# Patient Record
Sex: Male | Born: 1970 | Race: Black or African American | Hispanic: No | Marital: Married | Smoking: Never smoker
Health system: Southern US, Community
[De-identification: ages and names within clinical notes are randomized; demographics above are authoritative.]

---

## 2005-08-06 ENCOUNTER — Encounter: Admission: RE | Admit: 2005-08-06 | Discharge: 2005-08-06 | Payer: Self-pay | Admitting: Emergency Medicine

## 2007-03-17 ENCOUNTER — Encounter: Admission: RE | Admit: 2007-03-17 | Discharge: 2007-03-17 | Payer: Self-pay | Admitting: Family Medicine

## 2007-05-09 ENCOUNTER — Encounter: Admission: RE | Admit: 2007-05-09 | Discharge: 2007-05-09 | Payer: Self-pay | Admitting: Family Medicine

## 2013-05-22 ENCOUNTER — Ambulatory Visit
Admission: RE | Admit: 2013-05-22 | Discharge: 2013-05-22 | Disposition: A | Discharge status: Home / Self Care | Payer: BC Managed Care – PPO | Source: Ambulatory Visit | Attending: Family Medicine | Admitting: Family Medicine

## 2013-05-22 ENCOUNTER — Other Ambulatory Visit: Payer: Self-pay | Admitting: Family Medicine

## 2013-05-22 DIAGNOSIS — R200 Anesthesia of skin: Secondary | ICD-10-CM

## 2013-06-08 ENCOUNTER — Encounter: Payer: BC Managed Care – PPO | Admitting: Diagnostic Neuroimaging

## 2013-06-08 ENCOUNTER — Ambulatory Visit (INDEPENDENT_AMBULATORY_CARE_PROVIDER_SITE_OTHER): Payer: Self-pay

## 2013-06-08 ENCOUNTER — Ambulatory Visit (INDEPENDENT_AMBULATORY_CARE_PROVIDER_SITE_OTHER): Payer: 59 | Admitting: Diagnostic Neuroimaging

## 2013-06-08 DIAGNOSIS — G5600 Carpal tunnel syndrome, unspecified upper limb: Secondary | ICD-10-CM

## 2013-06-08 DIAGNOSIS — R200 Anesthesia of skin: Secondary | ICD-10-CM

## 2013-06-08 DIAGNOSIS — G5602 Carpal tunnel syndrome, left upper limb: Secondary | ICD-10-CM

## 2013-06-08 DIAGNOSIS — R202 Paresthesia of skin: Principal | ICD-10-CM

## 2013-06-08 DIAGNOSIS — Z0289 Encounter for other administrative examinations: Secondary | ICD-10-CM

## 2013-06-08 DIAGNOSIS — G5601 Carpal tunnel syndrome, right upper limb: Secondary | ICD-10-CM

## 2013-06-08 NOTE — Procedures (Signed)
   [facility]  NCS (NERVE CONDUCTION STUDY) WITH EMG (ELECTROMYOGRAPHY) REPORT   STUDY DATE: 06/05/13 PATIENT NAME: Jesus [name] DOB: 1971-02-17 MRN: [id number]  ORDERING CLINICIAN: Okey Regalarol Web, MD   TECHNOLOGIST: Geroge BasemanLorraine Jone  ELECTROMYOGRAPHER: Glenford BayleyVikram R. Paraskevi Funez, MD  CLINICAL INFORMATION: 43 year old male with hand numbness (right > left).  FINDINGS: NERVE CONDUCTION STUDY: Bilateral median motor responses have prolonged distal latencies (right 4.2 ms, left 4.4 ms, normal less than or equal to 4.2 ms), normal amplitudes, normal conduction velocities and normal F-wave latencies. Bilateral ulnar motor responses and F-wave latencies are normal. Bilateral median sensory responses have decreased amplitudes and slow conduction velocities (right 37 m/s, left 38 m/s, normal greater than or equal to 50 m/s). Bilateral ulnar sensory responses are normal.  NEEDLE ELECTROMYOGRAPHY: Needle examination of selected muscles of the right upper extremity (deltoid, biceps, triceps, flexor carpi radialis, first dorsal interosseous) and right C5-6 and C7-T1 paraspinal muscles are normal. No abnormal spontaneous activity at rest and normal motor unit recruitment on exertion.   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   INTERPRETING PHYSICIAN:  Suanne MarkerVIKRAM R. Jerimie Mancuso, MD Certified in Neurology, Neurophysiology and Neuroimaging  [facility] [address], [address] [place], [place] [address] [phone]

## 2016-09-17 DIAGNOSIS — I83899 Varicose veins of unspecified lower extremities with other complications: Secondary | ICD-10-CM | POA: Diagnosis not present

## 2016-09-17 DIAGNOSIS — I872 Venous insufficiency (chronic) (peripheral): Secondary | ICD-10-CM | POA: Diagnosis not present

## 2016-09-17 DIAGNOSIS — M79669 Pain in unspecified lower leg: Secondary | ICD-10-CM | POA: Diagnosis not present

## 2016-12-21 DIAGNOSIS — M5432 Sciatica, left side: Secondary | ICD-10-CM | POA: Diagnosis not present

## 2017-02-06 DIAGNOSIS — M79669 Pain in unspecified lower leg: Secondary | ICD-10-CM | POA: Diagnosis not present

## 2017-02-06 DIAGNOSIS — I83899 Varicose veins of unspecified lower extremities with other complications: Secondary | ICD-10-CM | POA: Diagnosis not present

## 2017-02-06 DIAGNOSIS — I872 Venous insufficiency (chronic) (peripheral): Secondary | ICD-10-CM | POA: Diagnosis not present

## 2017-02-20 DIAGNOSIS — Z13220 Encounter for screening for lipoid disorders: Secondary | ICD-10-CM | POA: Diagnosis not present

## 2017-02-20 DIAGNOSIS — Z0000 Encounter for general adult medical examination without abnormal findings: Secondary | ICD-10-CM | POA: Diagnosis not present

## 2017-02-20 DIAGNOSIS — R5382 Chronic fatigue, unspecified: Secondary | ICD-10-CM | POA: Diagnosis not present

## 2017-06-12 ENCOUNTER — Other Ambulatory Visit: Payer: Self-pay | Admitting: Family Medicine

## 2017-06-12 ENCOUNTER — Ambulatory Visit
Admission: RE | Admit: 2017-06-12 | Discharge: 2017-06-12 | Disposition: A | Discharge status: Home / Self Care | Payer: 59 | Source: Ambulatory Visit | Attending: Family Medicine | Admitting: Family Medicine

## 2017-06-12 DIAGNOSIS — M5489 Other dorsalgia: Secondary | ICD-10-CM

## 2017-06-12 DIAGNOSIS — M545 Low back pain: Secondary | ICD-10-CM | POA: Diagnosis not present

## 2017-06-12 DIAGNOSIS — M47816 Spondylosis without myelopathy or radiculopathy, lumbar region: Secondary | ICD-10-CM | POA: Diagnosis not present

## 2017-06-12 DIAGNOSIS — G8929 Other chronic pain: Secondary | ICD-10-CM | POA: Diagnosis not present

## 2017-06-25 DIAGNOSIS — M25572 Pain in left ankle and joints of left foot: Secondary | ICD-10-CM | POA: Diagnosis not present

## 2017-06-25 DIAGNOSIS — M545 Low back pain: Secondary | ICD-10-CM | POA: Diagnosis not present

## 2017-06-25 DIAGNOSIS — M79641 Pain in right hand: Secondary | ICD-10-CM | POA: Diagnosis not present

## 2017-07-02 DIAGNOSIS — M25572 Pain in left ankle and joints of left foot: Secondary | ICD-10-CM | POA: Diagnosis not present

## 2017-07-02 DIAGNOSIS — M79641 Pain in right hand: Secondary | ICD-10-CM | POA: Diagnosis not present

## 2017-07-02 DIAGNOSIS — M545 Low back pain: Secondary | ICD-10-CM | POA: Diagnosis not present

## 2017-07-19 DIAGNOSIS — M25572 Pain in left ankle and joints of left foot: Secondary | ICD-10-CM | POA: Diagnosis not present

## 2017-07-19 DIAGNOSIS — M79641 Pain in right hand: Secondary | ICD-10-CM | POA: Diagnosis not present

## 2017-07-19 DIAGNOSIS — M545 Low back pain: Secondary | ICD-10-CM | POA: Diagnosis not present

## 2017-07-26 DIAGNOSIS — M545 Low back pain: Secondary | ICD-10-CM | POA: Diagnosis not present

## 2017-07-26 DIAGNOSIS — M79641 Pain in right hand: Secondary | ICD-10-CM | POA: Diagnosis not present

## 2017-07-26 DIAGNOSIS — M25572 Pain in left ankle and joints of left foot: Secondary | ICD-10-CM | POA: Diagnosis not present

## 2017-08-05 DIAGNOSIS — M25572 Pain in left ankle and joints of left foot: Secondary | ICD-10-CM | POA: Diagnosis not present

## 2017-08-05 DIAGNOSIS — M545 Low back pain: Secondary | ICD-10-CM | POA: Diagnosis not present

## 2017-08-05 DIAGNOSIS — M79641 Pain in right hand: Secondary | ICD-10-CM | POA: Diagnosis not present

## 2017-08-23 DIAGNOSIS — M25572 Pain in left ankle and joints of left foot: Secondary | ICD-10-CM | POA: Diagnosis not present

## 2017-08-23 DIAGNOSIS — M545 Low back pain: Secondary | ICD-10-CM | POA: Diagnosis not present

## 2017-08-23 DIAGNOSIS — M79641 Pain in right hand: Secondary | ICD-10-CM | POA: Diagnosis not present

## 2017-08-29 DIAGNOSIS — M79641 Pain in right hand: Secondary | ICD-10-CM | POA: Diagnosis not present

## 2017-08-29 DIAGNOSIS — M545 Low back pain: Secondary | ICD-10-CM | POA: Diagnosis not present

## 2017-08-29 DIAGNOSIS — M25572 Pain in left ankle and joints of left foot: Secondary | ICD-10-CM | POA: Diagnosis not present

## 2018-04-02 DIAGNOSIS — Z0000 Encounter for general adult medical examination without abnormal findings: Secondary | ICD-10-CM | POA: Diagnosis not present

## 2018-04-02 DIAGNOSIS — Z125 Encounter for screening for malignant neoplasm of prostate: Secondary | ICD-10-CM | POA: Diagnosis not present

## 2018-05-26 ENCOUNTER — Ambulatory Visit
Admission: RE | Admit: 2018-05-26 | Discharge: 2018-05-26 | Disposition: A | Discharge status: Home / Self Care | Payer: 59 | Source: Ambulatory Visit | Attending: Family Medicine | Admitting: Family Medicine

## 2018-05-26 ENCOUNTER — Other Ambulatory Visit: Payer: Self-pay | Admitting: Family Medicine

## 2018-05-26 DIAGNOSIS — M79645 Pain in left finger(s): Secondary | ICD-10-CM

## 2018-08-08 DIAGNOSIS — J111 Influenza due to unidentified influenza virus with other respiratory manifestations: Secondary | ICD-10-CM | POA: Diagnosis not present

## 2018-08-08 DIAGNOSIS — R6889 Other general symptoms and signs: Secondary | ICD-10-CM | POA: Diagnosis not present

## 2018-10-17 DIAGNOSIS — K219 Gastro-esophageal reflux disease without esophagitis: Secondary | ICD-10-CM | POA: Diagnosis not present

## 2019-07-03 ENCOUNTER — Ambulatory Visit: Payer: 59 | Admitting: Family Medicine

## 2019-07-03 ENCOUNTER — Other Ambulatory Visit: Payer: Self-pay

## 2019-07-03 ENCOUNTER — Encounter: Payer: Self-pay | Admitting: Family Medicine

## 2019-07-03 ENCOUNTER — Ambulatory Visit (INDEPENDENT_AMBULATORY_CARE_PROVIDER_SITE_OTHER): Payer: 59

## 2019-07-03 VITALS — BP 120/80 | HR 63 | Ht 67.0 in | Wt 239.0 lb

## 2019-07-03 DIAGNOSIS — M7050 Other bursitis of knee, unspecified knee: Secondary | ICD-10-CM

## 2019-07-03 DIAGNOSIS — M25561 Pain in right knee: Secondary | ICD-10-CM | POA: Diagnosis not present

## 2019-07-03 NOTE — Patient Instructions (Signed)
Good to see you Thigh compression sleeve Vitamin D 2,000 IUs daily Exercise 3 times a week Voltaren gel over the counter See me again in 4-5 weeks

## 2019-07-03 NOTE — Progress Notes (Signed)
[facility] [address] [place] [address] Phone: [phone] Subjective:   I Ronelle Nigh am serving as a Neurosurgeon for Dr. Antoine Primas.  This visit occurred during the SARS-CoV-2 public health emergency.  Safety protocols were in place, including screening questions prior to the visit, additional usage of staff PPE, and extensive cleaning of exam room while observing appropriate contact time as indicated for disinfecting solutions.   I'm seeing this patient by the request  of:  Shirlean Mylar, MD  CC: Right knee pain  [id number]  Jesus [name] is a 49 y.o. male coming in with complaint of right knee pain. History of surgery in the right knee due to meniscus injury. Wears steel toe shoes at work. Noticed a knot on the medial side of the the knee. Some times he has calf pain.   Onset- chronic  Location - knee cap Duration- most painful after work  Character- throbbing  Aggravating factors- on his feet a lot  Reliving factors-  Therapies tried- prednisone Severity-  7/10 at its worse      Social History   Socioeconomic History  . Marital status: Divorced    Spouse name: Not on file  . Number of children: Not on file  . Years of education: Not on file  . Highest education level: Not on file  Occupational History  . Not on file  Tobacco Use  . Smoking status: Not on file  Substance and Sexual Activity  . Alcohol use: Not on file  . Drug use: Not on file  . Sexual activity: Not on file  Other Topics Concern  . Not on file  Social History Narrative  . Not on file   Social Determinants of Health   Financial Resource Strain:   . Difficulty of Paying Living Expenses: Not on file  Food Insecurity:   . Worried About Programme researcher, broadcasting/film/video in the Last Year: Not on file  . Ran Out of Food in the Last Year: Not on file  Transportation Needs:   . Lack of Transportation (Medical): Not on file  . Lack of Transportation (Non-Medical): Not  on file  Physical Activity:   . Days of Exercise per Week: Not on file  . Minutes of Exercise per Session: Not on file  Stress:   . Feeling of Stress : Not on file  Social Connections:   . Frequency of Communication with Friends and Family: Not on file  . Frequency of Social Gatherings with Friends and Family: Not on file  . Attends Religious Services: Not on file  . Active Member of Clubs or Organizations: Not on file  . Attends Banker Meetings: Not on file  . Marital Status: Not on file   Not on File no known drug allergies No family history on file.  No family history of autoimmune or No current outpatient medications on file.   Reviewed prior external information including notes and imaging from  primary care provider As well as notes that were available from care everywhere and other healthcare systems.  Past medical history, social, surgical and family history all reviewed in electronic medical record.  No pertanent information unless stated regarding to the chief complaint.   Review of Systems:  No headache, visual changes, nausea, vomiting, diarrhea, constipation, dizziness, abdominal pain, skin rash, fevers, chills, night sweats, weight loss, swollen lymph nodes, body aches,chest pain, shortness of breath, mood changes. POSITIVE muscle aches, joint swelling  Objective  Blood pressure  120/80, pulse 63, height 5\' 7"  (1.702 m), weight 239 lb (108.4 kg), SpO2 98 %.   General: No apparent distress alert and oriented x3 mood and affect normal, dressed appropriately.  HEENT: Pupils equal, extraocular movements intact  Respiratory: Patient's speak in full sentences and does not appear short of breath  Cardiovascular: No lower extremity edema, non tender, no erythema  Skin: Warm dry intact with no signs of infection or rash on extremities or on axial skeleton.  Abdomen: Soft nontender  Neuro: Cranial nerves II through XII are intact, neurovascularly intact in all  extremities with 2+ DTRs and 2+ pulses.  Lymph: No lymphadenopathy of posterior or anterior cervical chain or axillae bilaterally.  Gait normal with good balance and coordination.  MSK:  Non tender with full range of motion and good stability and symmetric strength and tone of shoulders, elbows, wrist, hip and ankles bilaterally.  Knee: Right knee Normal to inspection with no erythema or effusion or obvious bony abnormalities. Palpation pain over the medial area mostly over the pes anserine ROM full in flexion and extension and lower leg rotation. Ligaments with solid consistent endpoints including ACL, PCL, LCL, MCL. Negative Mcmurray's, Apley's, and Thessalonian tests. Non painful patellar compression. Patellar glide without crepitus. Patellar and quadriceps tendons unremarkable. Hamstring and quadriceps strength is normal. Contralateral knee unremarkable   Limited musculoskeletal ultrasound was performed and interpreted by Lyndal Pulley  Limited musculoskeletal ultrasound shows the patient does have severe Pez anserine bursitis.  Mild intrasubstance tearing of the hamstring tendon noted.  Medial meniscus does show surgical intervention noted.   Impression and Recommendations:     This case required medical decision making of moderate complexity. The above documentation has been reviewed and is accurate and complete Lyndal Pulley, DO       Note: This dictation was prepared with Dragon dictation along with smaller phrase technology. Any transcriptional errors that result from this process are unintentional.

## 2019-07-03 NOTE — Assessment & Plan Note (Addendum)
Discussed compression sleeve  Discussed home exercises, discussed which activities to do which wants to avoid.  We discussed icing regimen.  Topical anti-inflammatories we discussed as well and over-the-counter medications.  Determinants of some mild findings of instruments and secondary to Covid not able to go to certain places in the community.  Follow-up again in 4 weeks

## 2019-07-09 ENCOUNTER — Ambulatory Visit (INDEPENDENT_AMBULATORY_CARE_PROVIDER_SITE_OTHER): Payer: 59

## 2019-07-09 DIAGNOSIS — M25561 Pain in right knee: Secondary | ICD-10-CM

## 2019-08-13 ENCOUNTER — Ambulatory Visit: Payer: 59 | Admitting: Family Medicine

## 2019-08-26 ENCOUNTER — Ambulatory Visit: Payer: 59 | Admitting: Family Medicine

## 2019-08-26 ENCOUNTER — Other Ambulatory Visit: Payer: Self-pay

## 2019-08-26 ENCOUNTER — Encounter: Payer: Self-pay | Admitting: Family Medicine

## 2019-08-26 DIAGNOSIS — M7050 Other bursitis of knee, unspecified knee: Secondary | ICD-10-CM

## 2019-08-26 NOTE — Progress Notes (Signed)
Jesus [name] Phone: [phone] Subjective:   Jesus [name], am serving as a scribe for Dr. Hulan Saas. This visit occurred during the SARS-CoV-2 public health emergency.  Safety protocols were in place, including screening questions prior to the visit, additional usage of staff PPE, and extensive cleaning of exam room while observing appropriate contact time as indicated for disinfecting solutions.   I'm seeing this patient by the request  of:  Maurice Small, MD  CC: Right knee pain follow-up  IRS:[id number]   07/03/2019 Discussed compression sleeve  Discussed home exercises, discussed which activities to do which wants to avoid.  We discussed icing regimen.  Topical anti-inflammatories we discussed as well and over-the-counter medications.  Determinants of some mild findings of instruments and secondary to Covid not able to go to certain places in the community.  Follow-up again in 4 weeks  Update 08/26/2019 Jesus [name] is a 49 y.o. male coming in with complaint of right knee pain. Patient states that his pain been worse than last visit. Pain had improved but patient got COVID and has been sedentary. Noticed pain has been increasing. Patient states that it is starting affect daily activities, even tender to palpation when he touches the area.  States that at one point though the bump in the swelling in the area had completely resolved.    [name] past medical history on file. [name] past surgical history on file. Social History   Socioeconomic History  . Marital status: Divorced    Spouse name: Not on file  . Number of children: Not on file  . Years of education: Not on file  . Highest education level: Not on file  Occupational History  . Not on file  Tobacco Use  . Smoking status: Not on file  Substance and Sexual Activity  . Alcohol use: Not on file  . Drug use: Not on file  . Sexual activity: Not on file    Other Topics Concern  . Not on file  Social History Narrative  . Not on file   Social Determinants of Health   Financial Resource Strain:   . Difficulty of Paying Living Expenses:   Food Insecurity:   . Worried About Charity fundraiser in the Last Year:   . Arboriculturist in the Last Year:   Transportation Needs:   . Film/video editor (Medical):   Marland Kitchen Lack of Transportation (Non-Medical):   Physical Activity:   . Days of Exercise per Week:   . Minutes of Exercise per Session:   Stress:   . Feeling of Stress :   Social Connections:   . Frequency of Communication with Friends and Family:   . Frequency of Social Gatherings with Friends and Family:   . Attends Religious Services:   . Active Member of Clubs or Organizations:   . Attends Archivist Meetings:   Marland Kitchen Marital Status:    Not on File [name] family history on file. [name] current outpatient medications on file.   Reviewed prior external information including notes and imaging from  primary care provider As well as notes that were available from care everywhere and other healthcare systems.  Past medical history, social, surgical and family history all reviewed in electronic medical record.  [name] pertanent information unless stated regarding to the chief complaint.   Review of Systems:  [name] headache, visual changes, nausea, vomiting, diarrhea, constipation, dizziness, abdominal pain, skin rash, fevers,  chills, night sweats, weight loss, swollen lymph nodes, body aches, joint swelling, chest pain, shortness of breath, mood changes. POSITIVE muscle aches  Objective  Blood pressure 122/80, pulse 67, height 5\' 7"  (1.702 m), weight 239 lb (108.4 kg), SpO2 99 %.   General: [name] apparent distress alert and oriented x3 mood and affect normal, dressed appropriately.  HEENT: Pupils equal, extraocular movements intact  Respiratory: Patient's speak in full sentences and does not appear short of breath  Cardiovascular: [name]  lower extremity edema, non tender, [name] erythema  Neuro: Cranial nerves II through XII are intact, neurovascularly intact in all extremities with 2+ DTRs and 2+ pulses.  Gait normal with good balance and coordination.  MSK:  Non tender with full range of motion and good stability and symmetric strength and tone of shoulders, elbows, wrist, hip and ankles bilaterally.  Right knee shows the patient does have swelling over the Pez anserine area.  Calcific bursa noted at this time.  Does have some tightness of the hamstring.  Nontender on the joint line itself.  Full range of motion of the knee.  After verbal consent patient was prepped with alcohol swab and with a 25-gauge half inch needle injected with 0.5 cc of 0.5% Marcaine and 1 cc of Kenalog 40 mg/mL into the Pez anserine area.  Tolerated the procedure well.  Attempted removal of gelatinous material but was unable to get too much more.  Band-Aid placed.  Postinjection instructions given   Impression and Recommendations:     This case required medical decision making of moderate complexity. The above documentation has been reviewed and is accurate and complete , DO       Note: This dictation was prepared with Dragon dictation along with smaller phrase technology. Any transcriptional errors that result from this process are unintentional.

## 2019-08-26 NOTE — Assessment & Plan Note (Addendum)
Patient given injection today, tolerated procedure well, discussed icing regimen and home exercises, increase activity slowly.  Worsening pain of this chronic problem.  Did have some calcific changes.  Follow-up again in 4 to 8 weeks calcific area was freely movable and patient does state that it does fluctuant.  Worsening pain advanced imaging may be necessary.

## 2019-08-26 NOTE — Patient Instructions (Signed)
Good to see you  Ice is your friend Stay active but workout tomorrow  Tried injection and hope it helps See me again in 6 weeks

## 2019-10-07 ENCOUNTER — Encounter: Payer: Self-pay | Admitting: Family Medicine

## 2019-10-07 ENCOUNTER — Ambulatory Visit: Payer: 59 | Admitting: Family Medicine

## 2019-10-07 ENCOUNTER — Other Ambulatory Visit: Payer: Self-pay

## 2019-10-07 VITALS — BP 110/62 | HR 91 | Ht 67.0 in | Wt 236.0 lb

## 2019-10-07 DIAGNOSIS — M7050 Other bursitis of knee, unspecified knee: Secondary | ICD-10-CM

## 2019-10-07 DIAGNOSIS — M13861 Other specified arthritis, right knee: Secondary | ICD-10-CM | POA: Diagnosis not present

## 2019-10-07 NOTE — Progress Notes (Signed)
[facility] Phone: [phone] Subjective:   Jesus [name], am serving as a scribe for Dr. Hulan Saas. This visit occurred during the SARS-CoV-2 public health emergency.  Safety protocols were in place, including screening questions prior to the visit, additional usage of staff PPE, and extensive cleaning of exam room while observing appropriate contact time as indicated for disinfecting solutions.   I'm seeing this patient by the request  of:  Maurice Small, MD  CC: Knee pain follow-up  IOE:[id number]   08/26/2019 Patient given injection today, tolerated procedure well, discussed icing regimen and home exercises, increase activity slowly.  Worsening pain of this chronic problem.  Did have some calcific changes.  Follow-up again in 4 to 8 weeks calcific area was freely movable and patient does state that it does fluctuant.  Worsening pain advanced imaging may be necessary.  Update 10/07/2019 Jesus [name] is a 49 y.o. male coming in with complaint of knee pain. Patient states that he has been having good and bad days. Weight bearing increases his pain. Not much change since last visit.  Patient states some mild discomfort from time to time but nothing severe.  Has not noticed as much swelling.  [name] locking.  Patient does state that if he stands a long time at work it seems to be work     [name] past medical history on file. [name] past surgical history on file. Social History   Socioeconomic History  . Marital status: Divorced    Spouse name: Not on file  . Number of children: Not on file  . Years of education: Not on file  . Highest education level: Not on file  Occupational History  . Not on file  Tobacco Use  . Smoking status: Not on file  Substance and Sexual Activity  . Alcohol use: Not on file  . Drug use: Not on file  . Sexual activity: Not on file  Other Topics Concern  . Not on file  Social History  Narrative  . Not on file   Social Determinants of Health   Financial Resource Strain:   . Difficulty of Paying Living Expenses:   Food Insecurity:   . Worried About Charity fundraiser in the Last Year:   . Arboriculturist in the Last Year:   Transportation Needs:   . Film/video editor (Medical):   Marland Kitchen Lack of Transportation (Non-Medical):   Physical Activity:   . Days of Exercise per Week:   . Minutes of Exercise per Session:   Stress:   . Feeling of Stress :   Social Connections:   . Frequency of Communication with Friends and Family:   . Frequency of Social Gatherings with Friends and Family:   . Attends Religious Services:   . Active Member of Clubs or Organizations:   . Attends Archivist Meetings:   Marland Kitchen Marital Status:    Not on File [name] family history on file. [name] current outpatient medications on file.   Reviewed prior external information including notes and imaging from  primary care provider As well as notes that were available from care everywhere and other healthcare systems.  Past medical history, social, surgical and family history all reviewed in electronic medical record.  [name] pertanent information unless stated regarding to the chief complaint.   Review of Systems:  [name] headache, visual changes, nausea, vomiting, diarrhea, constipation, dizziness, abdominal pain, skin rash, fevers, chills, night  sweats, weight loss, swollen lymph nodes, body aches, joint swelling, chest pain, shortness of breath, mood changes. POSITIVE muscle aches  Objective  Blood pressure 110/62, pulse 91, height 5\' 7"  (1.702 m), weight 236 lb (107 kg), SpO2 99 %.   General: [name] apparent distress alert and oriented x3 mood and affect normal, dressed appropriately.  HEENT: Pupils equal, extraocular movements intact  Respiratory: Patient's speak in full sentences and does not appear short of breath  Cardiovascular: [name] lower extremity edema, non tender, [name] erythema  Neuro:  Cranial nerves II through XII are intact, neurovascularly intact in all extremities with 2+ DTRs and 2+ pulses.  Gait normal with good balance and coordination.  MSK: Right knee exam still shows some mild tenderness over the Pez anserine area.  Patient does have some improvement in range of motion.  Negative McMurray's.  [name] significant locking noted.  [name] crepitus noted.  [name] instability of the knee    Impression and Recommendations:     The above documentation has been reviewed and is accurate and complete , DO       Note: This dictation was prepared with Dragon dictation along with smaller phrase technology. Any transcriptional errors that result from this process are unintentional.

## 2019-10-07 NOTE — Patient Instructions (Signed)
[employer] and [employer] Toe Shoes Continue to give it more time See me in 6-8 weeks

## 2019-10-07 NOTE — Assessment & Plan Note (Signed)
Patient is doing relatively well at this time.  Seems to be more of a bursitis.  We discussed proper shoes.  Encouraged him to continue the exercises.  Patient did have a potential loose body pneumonia only when I consider monitoring.  Worsening pain I would like to do an injection intra-articular of the knee at follow-up in 6 to 8 weeks

## 2019-11-11 ENCOUNTER — Ambulatory Visit: Payer: 59 | Admitting: Family Medicine

## 2019-11-11 NOTE — Progress Notes (Deleted)
[place] Jesus [name] Phone: [phone] Subjective:    I'm seeing this patient by the request  of:  Maurice Small, MD  CC:   [id number]   10/07/2019 Patient is doing relatively well at this time.  Seems to be more of a bursitis.  We discussed proper shoes.  Encouraged him to continue the exercises.  Patient did have a potential loose body pneumonia only when I consider monitoring.  Worsening pain I would like to do an injection intra-articular of the knee at follow-up in 6 to 8 weeks  Update 11/11/2019 Jesus [name] is a 49 y.o. male coming in with complaint of right knee, pes anserine bursitis. Patient states        No past medical history on file. No past surgical history on file. Social History   Socioeconomic History  . Marital status: Divorced    Spouse name: Not on file  . Number of children: Not on file  . Years of education: Not on file  . Highest education level: Not on file  Occupational History  . Not on file  Tobacco Use  . Smoking status: Not on file  Substance and Sexual Activity  . Alcohol use: Not on file  . Drug use: Not on file  . Sexual activity: Not on file  Other Topics Concern  . Not on file  Social History Narrative  . Not on file   Social Determinants of Health   Financial Resource Strain:   . Difficulty of Paying Living Expenses:   Food Insecurity:   . Worried About Charity fundraiser in the Last Year:   . Arboriculturist in the Last Year:   Transportation Needs:   . Film/video editor (Medical):   Marland Kitchen Lack of Transportation (Non-Medical):   Physical Activity:   . Days of Exercise per Week:   . Minutes of Exercise per Session:   Stress:   . Feeling of Stress :   Social Connections:   . Frequency of Communication with Friends and Family:   . Frequency of Social Gatherings with Friends and Family:   . Attends Religious Services:   . Active Member of Clubs or  Organizations:   . Attends Archivist Meetings:   Marland Kitchen Marital Status:    Not on File No family history on file. No current outpatient medications on file.   Reviewed prior external information including notes and imaging from  primary care provider As well as notes that were available from care everywhere and other healthcare systems.  Past medical history, social, surgical and family history all reviewed in electronic medical record.  No pertanent information unless stated regarding to the chief complaint.   Review of Systems:  No headache, visual changes, nausea, vomiting, diarrhea, constipation, dizziness, abdominal pain, skin rash, fevers, chills, night sweats, weight loss, swollen lymph nodes, body aches, joint swelling, chest pain, shortness of breath, mood changes. POSITIVE muscle aches  Objective  There were no vitals taken for this visit.   General: No apparent distress alert and oriented x3 mood and affect normal, dressed appropriately.  HEENT: Pupils equal, extraocular movements intact  Respiratory: Patient's speak in full sentences and does not appear short of breath  Cardiovascular: No lower extremity edema, non tender, no erythema  Neuro: Cranial nerves II through XII are intact, neurovascularly intact in all extremities with 2+ DTRs and 2+ pulses.  Gait normal with good balance and coordination.  MSK:  Non tender with full range of motion and good stability and symmetric strength and tone of shoulders, elbows, wrist, hip, knee and ankles bilaterally.     Impression and Recommendations:     The above documentation has been reviewed and is accurate and complete Wilford Grist       Note: This dictation was prepared with Dragon dictation along with smaller phrase technology. Any transcriptional errors that result from this process are unintentional.

## 2019-11-19 ENCOUNTER — Ambulatory Visit
Admission: RE | Admit: 2019-11-19 | Discharge: 2019-11-19 | Disposition: A | Discharge status: Home / Self Care | Payer: 59 | Source: Ambulatory Visit | Attending: Family Medicine | Admitting: Family Medicine

## 2019-11-19 ENCOUNTER — Other Ambulatory Visit: Payer: Self-pay | Admitting: Family Medicine

## 2019-11-19 DIAGNOSIS — R52 Pain, unspecified: Secondary | ICD-10-CM

## 2019-12-11 ENCOUNTER — Ambulatory Visit: Payer: 59 | Admitting: Podiatry

## 2019-12-11 ENCOUNTER — Other Ambulatory Visit: Payer: Self-pay

## 2019-12-11 ENCOUNTER — Encounter: Payer: Self-pay | Admitting: Podiatry

## 2019-12-11 ENCOUNTER — Ambulatory Visit (INDEPENDENT_AMBULATORY_CARE_PROVIDER_SITE_OTHER): Payer: 59

## 2019-12-11 DIAGNOSIS — M778 Other enthesopathies, not elsewhere classified: Secondary | ICD-10-CM | POA: Diagnosis not present

## 2019-12-11 DIAGNOSIS — M7672 Peroneal tendinitis, left leg: Secondary | ICD-10-CM

## 2019-12-11 DIAGNOSIS — Q666 Other congenital valgus deformities of feet: Secondary | ICD-10-CM

## 2019-12-11 NOTE — Patient Instructions (Signed)
Flat Feet, Adult  Normally, a foot has a curve, called an arch, on its inner side. The arch creates a gap between the foot and the ground. Flat feet is a common condition in which one or both feet do not have an arch. What are the causes? This condition may be caused by:  Failure of a normal arch to develop during childhood.  An injury to tendons and ligaments in the foot, such as to the tendon that supports the arch (posterior tibial tendon).  Loose tendons or ligaments in the foot.  A wearing down of the arch over time.  Injury to bones in the foot.  An abnormality in the bones of the foot, called tarsal coalition. This happens when two or more bones in the foot are joined together (fused) before birth. What increases the risk? This condition is more likely to develop in:  Females.  Adults age 40 or older.  People who: ? Have a family history of flat feet. ? Have a history of childhood flexible flatfoot. ? Are obese. ? Have diabetes. ? Have high blood pressure. ? Participate in high-impact sports. ? Have inflammatory arthritis. ? Have a history of broken (fractured) or dislocated bones in the foot. What are the signs or symptoms? Symptoms of this condition include:  Pain or tightness along the bottom of the foot.  Foot pain that gets worse with activity.  Swelling of the inner side of the foot.  Swelling of the ankle.  Pain on the outer side of the ankle.  Changes in the way that you walk (gait).  Pronation. This is when the foot and ankle lean inward when you are standing.  Bony bumps on the top or inner side of the foot. How is this diagnosed? This condition is diagnosed with a physical exam of your foot and ankle. Your health care provider may also:  Look at your shoes for patterns of wear on the soles.  Order imaging tests, such as X-rays, a CT scan, or an MRI.  Refer you to a health care provider who specializes in feet (podiatrist) or a physical  therapist. How is this treated? This condition may be treated with:  Stretching exercises or physical therapy. This helps to increase range of motion and relieve pain.  A shoe insert (orthotic). This helps to support the arch of your foot. Orthotics can be purchased from a store or can be custom-made by your health care provider.  Wearing shoes with appropriate arch support. This is especially important for athletes.  Medicines. These may be prescribed to relieve pain.  An ankle brace, boot, or cast. These may be used to relieve pressure on your foot. You may be given crutches if walking is painful.  Surgery. This may be done to improve the alignment of your foot. This is only needed if your posterior tibial tendon is torn or if you have tarsal coalition. Follow these instructions at home: Activity  Do any exercises as told by your health care provider.  If an activity causes pain, avoid it or try to find another activity that does not cause pain. General instructions  Wear orthotics and appropriate shoes as told by your health care provider.  Take over-the-counter and prescription medicines only as told by your health care provider.  Wear an ankle brace, boot, or cast as told by your health care provider.  Use crutches as told by your health care provider.  Keep all follow-up visits as told by your health care   provider. This is important. How is this prevented? To prevent the condition from getting worse:  Wear comfortable, supportive shoes that are appropriate for your activities.  Maintain a healthy weight.  Stay active in a way that your health care provider recommends. This will help to keep your feet flexible and strong.  Manage long-term (chronic) health conditions, such as diabetes, high blood pressure, and inflammatory arthritis.  Work with a health care provider if you have concerns about your feet or shoes. Contact a health care provider if:  You have pain in  your foot or lower leg that gets worse or does not improve with medicine.  You have pain or difficulty when walking.  You have problems with your orthotics. Summary  Flat feet is a common condition in which one or both feet do not have a curve, called an arch, on the inner side.  Your health care provider may recommend a shoe insert (orthotic) or shoes with the appropriate arch support.  Other treatments may include stretching exercises or physical therapy, medicines to relieve pain, and wearing an ankle brace, boot, or cast.  Surgery may be done if you have a tear in the tendon that supports your arch (posterior tibial tendon) or if two or more of your foot bones were joined together (fused)  before birth (tarsal coalition). This information is not intended to replace advice given to you by your health care provider. Make sure you discuss any questions you have with your health care provider. Document Revised: 09/04/2018 Document Reviewed: 07/25/2016 Elsevier Patient Education  2020 [employer]  

## 2019-12-15 ENCOUNTER — Encounter: Payer: Self-pay | Admitting: Podiatry

## 2019-12-15 DIAGNOSIS — M79676 Pain in unspecified toe(s): Secondary | ICD-10-CM

## 2019-12-15 NOTE — Progress Notes (Signed)
Subjective:  Patient ID: Jesus [name], male    DOB: 04-Jun-1970,  MRN: [id number]  Chief Complaint  Patient presents with  . Foot Pain    left foot pain    49 y.o. male presents with the above complaint.  Patient presents with primary complaint of left lateral foot pain that has been going on for 4 to 6 months has progressively gotten worse.  Patient states is acutely gotten worse over the last month.  He is constantly on his foot he wears steel toed boots.  There is some burning tingling associated with this sometimes numbness.  Patient is not a diabetic is not on a blood thinner.  He denies any other acute complaints.  He would like to discuss various treatment options he has not seen anyone else prior to seeing me.  He was given prednisone by his primary care doctor.   Review of Systems: Negative except as noted in the HPI. Denies N/V/F/Ch.  History reviewed. No pertinent past medical history.  Current Outpatient Medications:  .  predniSONE (DELTASONE) 20 MG tablet, Take 40 mg by mouth daily., Disp: , Rfl:   Social History   Tobacco Use  Smoking Status Not on file    No Known Allergies Objective:  There were no vitals filed for this visit. There is no height or weight on file to calculate BMI. Constitutional Well developed. Well nourished.  Vascular Dorsalis pedis pulses palpable bilaterally. Posterior tibial pulses palpable bilaterally. Capillary refill normal to all digits.  No cyanosis or clubbing noted. Pedal hair growth normal.  Neurologic Normal speech. Oriented to person, place, and time. Epicritic sensation to light touch grossly present bilaterally.  Dermatologic Nails well groomed and normal in appearance. No open wounds. No skin lesions.  Orthopedic:  Pain on palpation to the left lateral foot at the insertion of the fifth metatarsal base.  Pain on palpation to the insertion of the peroneal tendon as well.  Pain with resisted dorsiflexion eversion.  Mild  pain with plantarflexion and inversion of the foot active and passive to the left side.  Gait examination shows pes planovalgus foot structure with calcaneal eversion of the foot too many toe sign partially able to recreate the arch with dorsiflexion of the hallux.   Radiographs: 3 views of skeletally mature adult left foot: Severe decrease in calcaneal inclination angle increase in talar declination angle posterior and plantar heel spurring noted.  Anterior break in the cyma line.  No elevatus present.  Medial column fault noted.  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  No other bony abnormalities identified Assessment:   1. Peroneal tendinitis of left lower extremity   2. Pes planovalgus    Plan:  Patient was evaluated and treated and all questions answered.  Left peroneal tendinitis -I explained to the patient the etiology of tendinitis and various treatment options were extensively discussed.  I believe patient underlying driving forces the underlying pes planus deformity.  I believe patient will eventually benefit from a steroid injection however for now given the amount of pain that he is having which is moderate to severe I believe he will benefit from cam boot immobilization. -Cam boot was dispensed  Severe pes planovalgus bilateral -I explained patient the etiology of pes planovalgus deformity and various treatment options were extensively discussed with the patient.  I believe he will benefit from custom-made orthotics to help control the hindfoot motion as well as support the arch of the foot.  Patient agrees with the plan  would like to proceed with obtaining orthotics. -He will be scheduled see rec for custom-made orthotics  Return for Sched with Raiford Noble for [employer].

## 2019-12-23 ENCOUNTER — Other Ambulatory Visit: Payer: 59 | Admitting: Orthotics

## 2019-12-23 ENCOUNTER — Other Ambulatory Visit: Payer: Self-pay

## 2019-12-23 DIAGNOSIS — M7672 Peroneal tendinitis, left leg: Secondary | ICD-10-CM

## 2019-12-23 DIAGNOSIS — Q666 Other congenital valgus deformities of feet: Secondary | ICD-10-CM

## 2019-12-24 ENCOUNTER — Telehealth: Payer: Self-pay | Admitting: *Deleted

## 2019-12-24 NOTE — Telephone Encounter (Signed)
Pt called states he has an question concerning his foot.

## 2019-12-24 NOTE — Telephone Encounter (Signed)
Left message informing pt that if he would leave his question often I can call again with the answer.

## 2020-01-01 DIAGNOSIS — M79676 Pain in unspecified toe(s): Secondary | ICD-10-CM

## 2020-01-14 ENCOUNTER — Other Ambulatory Visit: Payer: Self-pay

## 2020-01-14 ENCOUNTER — Ambulatory Visit: Payer: 59 | Admitting: Orthotics

## 2020-01-14 DIAGNOSIS — M7672 Peroneal tendinitis, left leg: Secondary | ICD-10-CM

## 2020-01-14 DIAGNOSIS — Q666 Other congenital valgus deformities of feet: Secondary | ICD-10-CM

## 2020-01-14 NOTE — Progress Notes (Signed)
Patient came in today to pick up custom made foot orthotics.  The goals were accomplished and the patient reported no dissatisfaction with said orthotics.  Patient was advised of breakin period and how to report any issues. 

## 2020-01-15 ENCOUNTER — Encounter: Payer: Self-pay | Admitting: Podiatry

## 2020-01-15 ENCOUNTER — Other Ambulatory Visit: Payer: Self-pay

## 2020-01-15 ENCOUNTER — Ambulatory Visit: Payer: 59 | Admitting: Podiatry

## 2020-01-15 DIAGNOSIS — M7672 Peroneal tendinitis, left leg: Secondary | ICD-10-CM | POA: Diagnosis not present

## 2020-01-15 DIAGNOSIS — Q666 Other congenital valgus deformities of feet: Secondary | ICD-10-CM

## 2020-01-15 NOTE — Progress Notes (Signed)
  Subjective:  Patient ID: Jesus [name], male    DOB: 1970-12-14,  MRN: [id number]  Chief Complaint  Patient presents with  . Foot Pain    4 week follow up left    49 y.o. male presents with the above complaint.  Patient presents with a follow-up left peroneal tendinitis at the insertion.  Patient states he is doing a lot better.  He does not have any further pain.  He denies any other acute complaints.  He has been ambulating with a cam boot.  He has picked up his orthotics.   Review of Systems: Negative except as noted in the HPI. Denies N/V/F/Ch.  No past medical history on file.  Current Outpatient Medications:  .  predniSONE (DELTASONE) 20 MG tablet, Take 40 mg by mouth daily., Disp: , Rfl:   Social History   Tobacco Use  Smoking Status Not on file    No Known Allergies Objective:  There were no vitals filed for this visit. There is no height or weight on file to calculate BMI. Constitutional Well developed. Well nourished.  Vascular Dorsalis pedis pulses palpable bilaterally. Posterior tibial pulses palpable bilaterally. Capillary refill normal to all digits.  No cyanosis or clubbing noted. Pedal hair growth normal.  Neurologic Normal speech. Oriented to person, place, and time. Epicritic sensation to light touch grossly present bilaterally.  Dermatologic Nails well groomed and normal in appearance. No open wounds. No skin lesions.  Orthopedic:  No pain on palpation to the left lateral foot at the insertion of the fifth metatarsal base.  No pain on palpation to the insertion of the peroneal tendon as well.  No pain with resisted dorsiflexion eversion.  Mild pain with plantarflexion and inversion of the foot active and passive to the left side.  Gait examination shows pes planovalgus foot structure with calcaneal eversion of the foot too many toe sign partially able to recreate the arch with dorsiflexion of the hallux.   Radiographs: 3 views of skeletally mature  adult left foot: Severe decrease in calcaneal inclination angle increase in talar declination angle posterior and plantar heel spurring noted.  Anterior break in the cyma line.  No elevatus present.  Medial column fault noted.  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  No other bony abnormalities identified Assessment:   1. Peroneal tendinitis of left lower extremity   2. Pes planovalgus    Plan:  Patient was evaluated and treated and all questions answered.  Left peroneal tendinitis -Clinically resolved in the cam boot.  I have asked him to transition to regular sneakers.  If he is unable to do so properly he may benefit from a Tri-Lock ankle brace.  I have asked him to come back and see me if he is having difficult time transitioning from boot to regular sneakers.  Patient states understanding  Severe pes planovalgus bilateral -I explained patient the etiology of pes planovalgus deformity and various treatment options were extensively discussed with the patient.  I believe he will benefit from custom-made orthotics to help control the hindfoot motion as well as support the arch of the foot.  Patient agrees with the plan would like to proceed with obtaining orthotics. -Patient picked up his orthotics and is ambulating well without any acute complaints.  No follow-ups on file.

## 2020-01-18 ENCOUNTER — Encounter: Payer: Self-pay | Admitting: Podiatry

## 2020-01-19 ENCOUNTER — Encounter: Payer: Self-pay | Admitting: Podiatry

## 2020-01-27 ENCOUNTER — Encounter: Payer: Self-pay | Admitting: Podiatry

## 2020-01-27 ENCOUNTER — Ambulatory Visit: Payer: 59 | Admitting: Podiatry

## 2020-01-27 ENCOUNTER — Other Ambulatory Visit: Payer: Self-pay

## 2020-01-27 DIAGNOSIS — M7672 Peroneal tendinitis, left leg: Secondary | ICD-10-CM | POA: Diagnosis not present

## 2020-01-27 DIAGNOSIS — Q666 Other congenital valgus deformities of feet: Secondary | ICD-10-CM | POA: Diagnosis not present

## 2020-01-27 NOTE — Progress Notes (Signed)
Subjective:  Patient ID: Jesus [name], male    DOB: Aug 01, 1970,  MRN: [id number]  Chief Complaint  Patient presents with   Follow-up    Still having some soreness- states he may need a brace for some support- overall sees important     49 y.o. male presents with the above complaint.  Patient presents with a follow-up left peroneal tendinitis at the insertion.  He states is doing a lot better.  However he is having some residual pain to the lateral foot.  He has picked up his orthotics and is progressing well.  He denies any other acute complaints.   Review of Systems: Negative except as noted in the HPI. Denies N/V/F/Ch.  No past medical history on file.  Current Outpatient Medications:    predniSONE (DELTASONE) 20 MG tablet, Take 40 mg by mouth daily., Disp: , Rfl:   Social History   Tobacco Use  Smoking Status Not on file    No Known Allergies Objective:  There were no vitals filed for this visit. There is no height or weight on file to calculate BMI. Constitutional Well developed. Well nourished.  Vascular Dorsalis pedis pulses palpable bilaterally. Posterior tibial pulses palpable bilaterally. Capillary refill normal to all digits.  No cyanosis or clubbing noted. Pedal hair growth normal.  Neurologic Normal speech. Oriented to person, place, and time. Epicritic sensation to light touch grossly present bilaterally.  Dermatologic Nails well groomed and normal in appearance. No open wounds. No skin lesions.  Orthopedic:  Mild pain on palpation to the left lateral foot at the insertion of the fifth metatarsal base.  Mild pain on palpation to the insertion of the peroneal tendon as well.  Mild pain with resisted dorsiflexion eversion.  Mild pain with plantarflexion and inversion of the foot active and passive to the left side.  Gait examination shows pes planovalgus foot structure with calcaneal eversion of the foot too many toe sign partially able to recreate the arch  with dorsiflexion of the hallux.   Radiographs: 3 views of skeletally mature adult left foot: Severe decrease in calcaneal inclination angle increase in talar declination angle posterior and plantar heel spurring noted.  Anterior break in the cyma line.  No elevatus present.  Medial column fault noted.  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  No other bony abnormalities identified Assessment:   1. Pes planovalgus   2. Peroneal tendinitis of left lower extremity    Plan:  Patient was evaluated and treated and all questions answered.  Left peroneal tendinitis -Given the patient still has some residual pain I believe patient will benefit from steroid injection as well as a Tri-Lock ankle brace.  Patient was studies and agrees with the plan that his pain is not quite gone away.  I discussed with the patient that there is a high risk of rupture associated with it given the steroid near the tendon.  Patient would like to proceed with the injection despite the risks. -A steroid injection was performed at left lateral foot a point of maximal tenderness using 1% plain Lidocaine and 10 mg of Kenalog. This was well tolerated. -Tri-Lock ankle brace was dispensed  Severe pes planovalgus bilateral -I explained patient the etiology of pes planovalgus deformity and various treatment options were extensively discussed with the patient.  I believe he will benefit from custom-made orthotics to help control the hindfoot motion as well as support the arch of the foot.  Patient agrees with the plan would like to proceed  with obtaining orthotics. -Patient picked up his orthotics and is ambulating well without any acute complaints.  No follow-ups on file.

## 2020-02-22 ENCOUNTER — Ambulatory Visit: Payer: 59 | Admitting: Podiatry

## 2020-03-18 ENCOUNTER — Other Ambulatory Visit: Payer: Self-pay

## 2020-03-18 ENCOUNTER — Ambulatory Visit: Payer: 59 | Admitting: Podiatry

## 2020-03-18 DIAGNOSIS — M7672 Peroneal tendinitis, left leg: Secondary | ICD-10-CM | POA: Diagnosis not present

## 2020-03-18 DIAGNOSIS — Q666 Other congenital valgus deformities of feet: Secondary | ICD-10-CM

## 2020-03-22 ENCOUNTER — Encounter: Payer: Self-pay | Admitting: Podiatry

## 2020-03-22 NOTE — Progress Notes (Signed)
  Subjective:  Patient ID: Jesus [name], male    DOB: 1970-07-01,  MRN: [id number]  Chief Complaint  Patient presents with  . Foot Pain    Pt stated that he is doing better he still has some pain but its manily when he is working or on his foot     49 y.o. male presents with the above complaint.  Patient presents with a follow-up left peroneal tendinitis at the insertion.  He states is doing a lot better.  He does not have any further pain.  He has been able to ambulate with orthotics which are functioning really well.  He denies any other acute complaints   Review of Systems: Negative except as noted in the HPI. Denies N/V/F/Ch.  No past medical history on file.  Current Outpatient Medications:  .  predniSONE (DELTASONE) 20 MG tablet, Take 40 mg by mouth daily. (Patient not taking: Reported on 03/18/2020), Disp: , Rfl:   Social History   Tobacco Use  Smoking Status Not on file    No Known Allergies Objective:  There were no vitals filed for this visit. There is no height or weight on file to calculate BMI. Constitutional Well developed. Well nourished.  Vascular Dorsalis pedis pulses palpable bilaterally. Posterior tibial pulses palpable bilaterally. Capillary refill normal to all digits.  No cyanosis or clubbing noted. Pedal hair growth normal.  Neurologic Normal speech. Oriented to person, place, and time. Epicritic sensation to light touch grossly present bilaterally.  Dermatologic Nails well groomed and normal in appearance. No open wounds. No skin lesions.  Orthopedic:  Now pain on palpation to the left lateral foot at the insertion of the fifth metatarsal base.  No pain on palpation to the insertion of the peroneal tendon as well.  No pain with resisted dorsiflexion eversion.  Mild pain with plantarflexion and inversion of the foot active and passive to the left side.  Gait examination shows pes planovalgus foot structure with calcaneal eversion of the foot too many  toe sign partially able to recreate the arch with dorsiflexion of the hallux.   Radiographs: 3 views of skeletally mature adult left foot: Severe decrease in calcaneal inclination angle increase in talar declination angle posterior and plantar heel spurring noted.  Anterior break in the cyma line.  No elevatus present.  Medial column fault noted.  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  No other bony abnormalities identified Assessment:   1. Peroneal tendinitis of left lower extremity   2. Pes planovalgus    Plan:  Patient was evaluated and treated and all questions answered.  Left peroneal tendinitis -Clinically the pain has completely resolved.  Patient overall is doing a lot better.  I discussed with him the importance of continuing shoe gear modification as well as orthotics management for prevention of this pain.  Patient states understanding.  He will continue to follow and wear orthotics.  Severe pes planovalgus bilateral -I explained patient the etiology of pes planovalgus deformity and various treatment options were extensively discussed with the patient.  I believe he will benefit from custom-made orthotics to help control the hindfoot motion as well as support the arch of the foot.  Patient agrees with the plan would like to proceed with obtaining orthotics. -Patient picked up his orthotics and is ambulating well without any acute complaints.  No follow-ups on file.

## 2020-09-14 DIAGNOSIS — M25562 Pain in left knee: Secondary | ICD-10-CM | POA: Diagnosis not present

## 2020-09-20 DIAGNOSIS — S8390XA Sprain of unspecified site of unspecified knee, initial encounter: Secondary | ICD-10-CM | POA: Diagnosis not present

## 2020-09-20 DIAGNOSIS — M25562 Pain in left knee: Secondary | ICD-10-CM | POA: Diagnosis not present

## 2020-10-19 DIAGNOSIS — M2392 Unspecified internal derangement of left knee: Secondary | ICD-10-CM | POA: Diagnosis not present

## 2020-11-14 DIAGNOSIS — Z0000 Encounter for general adult medical examination without abnormal findings: Secondary | ICD-10-CM | POA: Diagnosis not present

## 2020-11-14 DIAGNOSIS — Z13220 Encounter for screening for lipoid disorders: Secondary | ICD-10-CM | POA: Diagnosis not present

## 2020-11-14 DIAGNOSIS — Z125 Encounter for screening for malignant neoplasm of prostate: Secondary | ICD-10-CM | POA: Diagnosis not present

## 2020-11-14 DIAGNOSIS — Z5181 Encounter for therapeutic drug level monitoring: Secondary | ICD-10-CM | POA: Diagnosis not present

## 2020-11-21 DIAGNOSIS — M25562 Pain in left knee: Secondary | ICD-10-CM | POA: Diagnosis not present

## 2020-12-08 DIAGNOSIS — H40013 Open angle with borderline findings, low risk, bilateral: Secondary | ICD-10-CM | POA: Diagnosis not present

## 2020-12-20 DIAGNOSIS — M25562 Pain in left knee: Secondary | ICD-10-CM | POA: Diagnosis not present

## 2020-12-31 DIAGNOSIS — M25562 Pain in left knee: Secondary | ICD-10-CM | POA: Diagnosis not present

## 2021-01-10 DIAGNOSIS — S83282D Other tear of lateral meniscus, current injury, left knee, subsequent encounter: Secondary | ICD-10-CM | POA: Diagnosis not present

## 2021-02-15 DIAGNOSIS — S83271A Complex tear of lateral meniscus, current injury, right knee, initial encounter: Secondary | ICD-10-CM | POA: Diagnosis not present

## 2021-02-15 DIAGNOSIS — X58XXXA Exposure to other specified factors, initial encounter: Secondary | ICD-10-CM | POA: Diagnosis not present

## 2021-02-15 DIAGNOSIS — S83272A Complex tear of lateral meniscus, current injury, left knee, initial encounter: Secondary | ICD-10-CM | POA: Diagnosis not present

## 2021-02-15 DIAGNOSIS — G8918 Other acute postprocedural pain: Secondary | ICD-10-CM | POA: Diagnosis not present

## 2021-02-15 DIAGNOSIS — Y999 Unspecified external cause status: Secondary | ICD-10-CM | POA: Diagnosis not present

## 2021-02-15 DIAGNOSIS — M659 Synovitis and tenosynovitis, unspecified: Secondary | ICD-10-CM | POA: Diagnosis not present

## 2021-02-15 DIAGNOSIS — M94261 Chondromalacia, right knee: Secondary | ICD-10-CM | POA: Diagnosis not present

## 2021-02-22 DIAGNOSIS — M25562 Pain in left knee: Secondary | ICD-10-CM | POA: Diagnosis not present

## 2021-03-01 DIAGNOSIS — M25562 Pain in left knee: Secondary | ICD-10-CM | POA: Diagnosis not present

## 2021-03-03 DIAGNOSIS — M25562 Pain in left knee: Secondary | ICD-10-CM | POA: Diagnosis not present

## 2021-03-07 DIAGNOSIS — M25562 Pain in left knee: Secondary | ICD-10-CM | POA: Diagnosis not present

## 2021-03-10 DIAGNOSIS — M25562 Pain in left knee: Secondary | ICD-10-CM | POA: Diagnosis not present

## 2021-03-14 DIAGNOSIS — M25562 Pain in left knee: Secondary | ICD-10-CM | POA: Diagnosis not present

## 2021-03-17 DIAGNOSIS — M25562 Pain in left knee: Secondary | ICD-10-CM | POA: Diagnosis not present

## 2021-03-28 DIAGNOSIS — M25562 Pain in left knee: Secondary | ICD-10-CM | POA: Diagnosis not present

## 2021-04-07 DIAGNOSIS — K648 Other hemorrhoids: Secondary | ICD-10-CM | POA: Diagnosis not present

## 2021-04-07 DIAGNOSIS — Z1211 Encounter for screening for malignant neoplasm of colon: Secondary | ICD-10-CM | POA: Diagnosis not present

## 2021-10-06 IMAGING — CR DG FOOT 2V*L*
2 series · 2 of 2 positions shown · non-contrast
Comparison: None.

CLINICAL DATA: LEFT LATERAL foot pain for 3 weeks. No known injury.

EXAM:
LEFT FOOT - 2 VIEW

[t foot ap left]
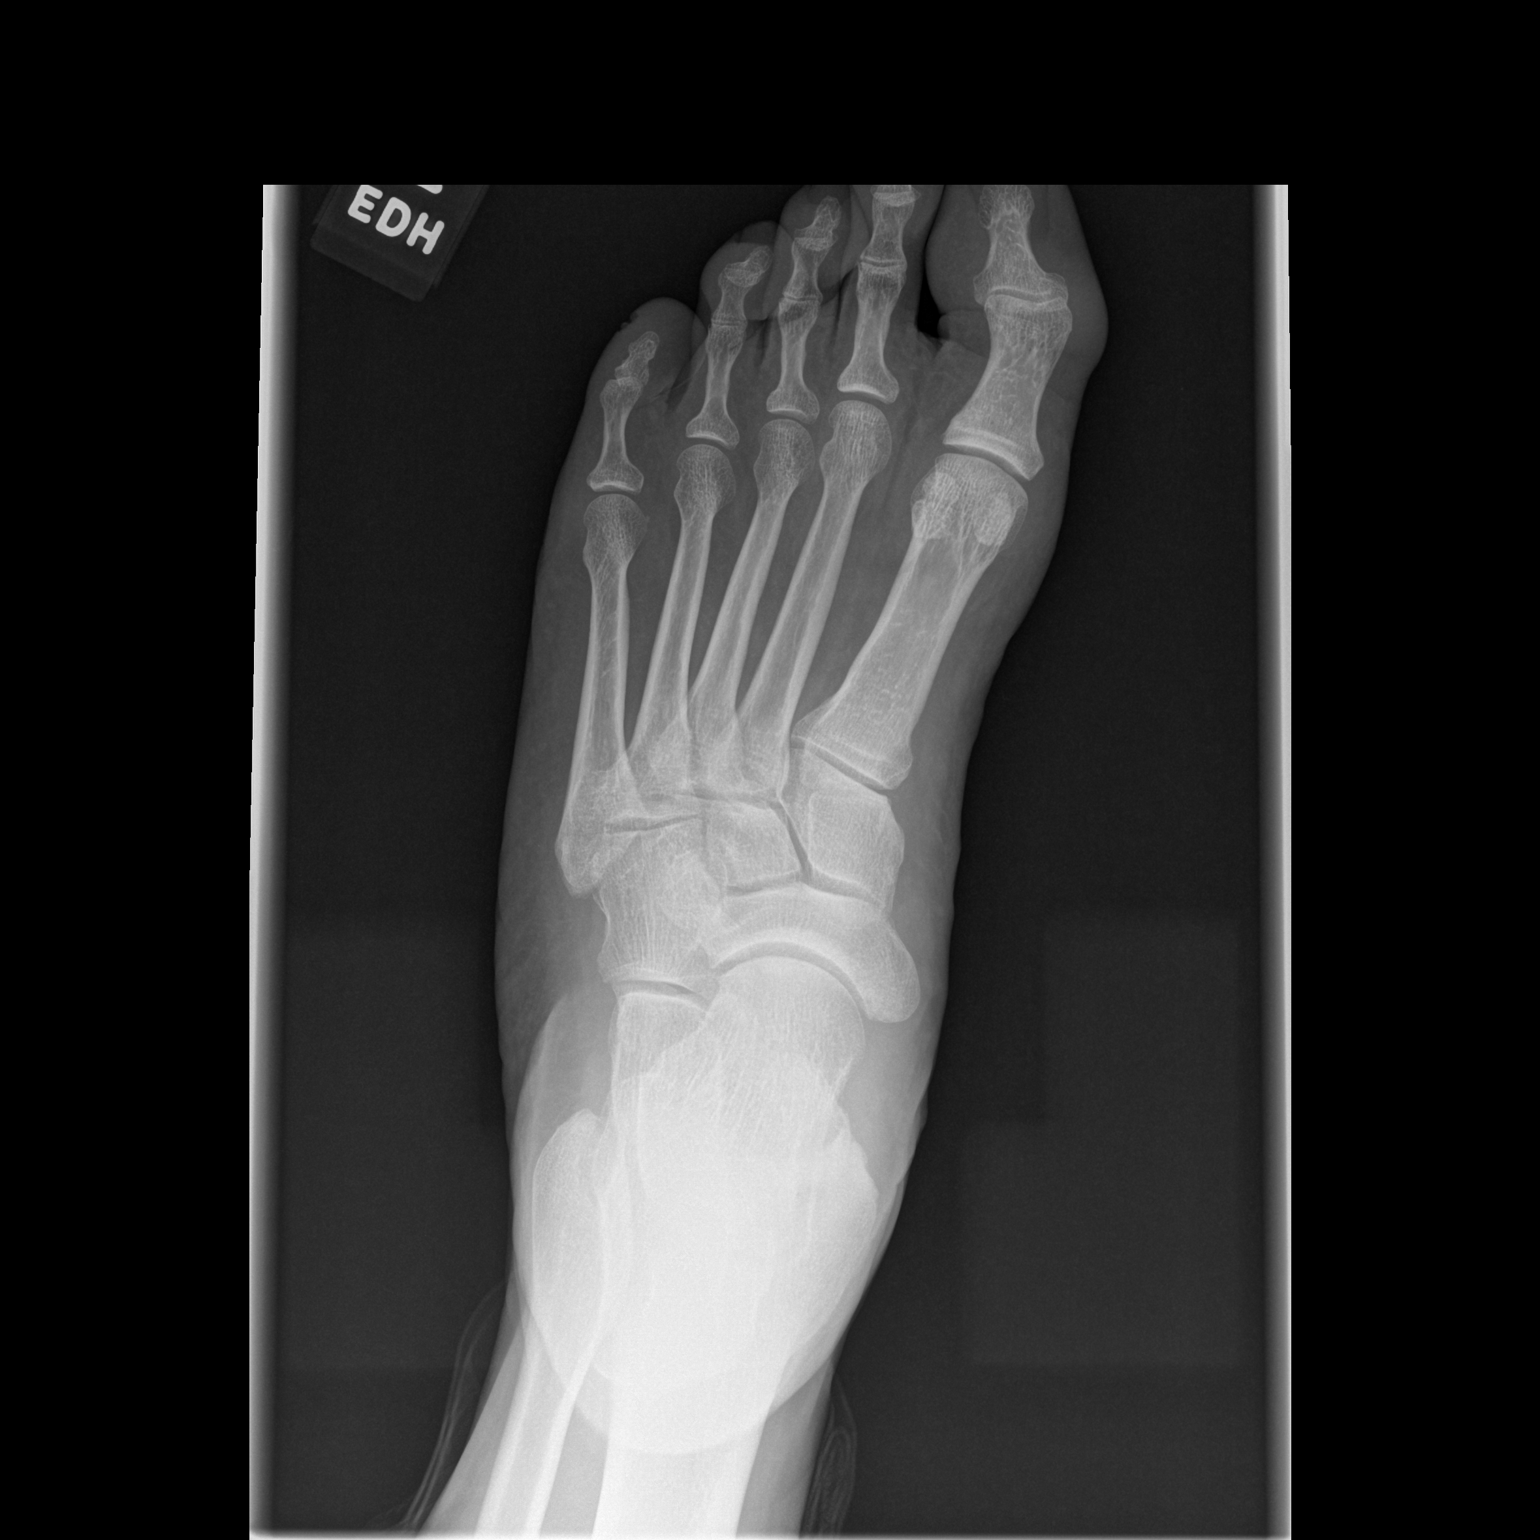

[t foot lat left]
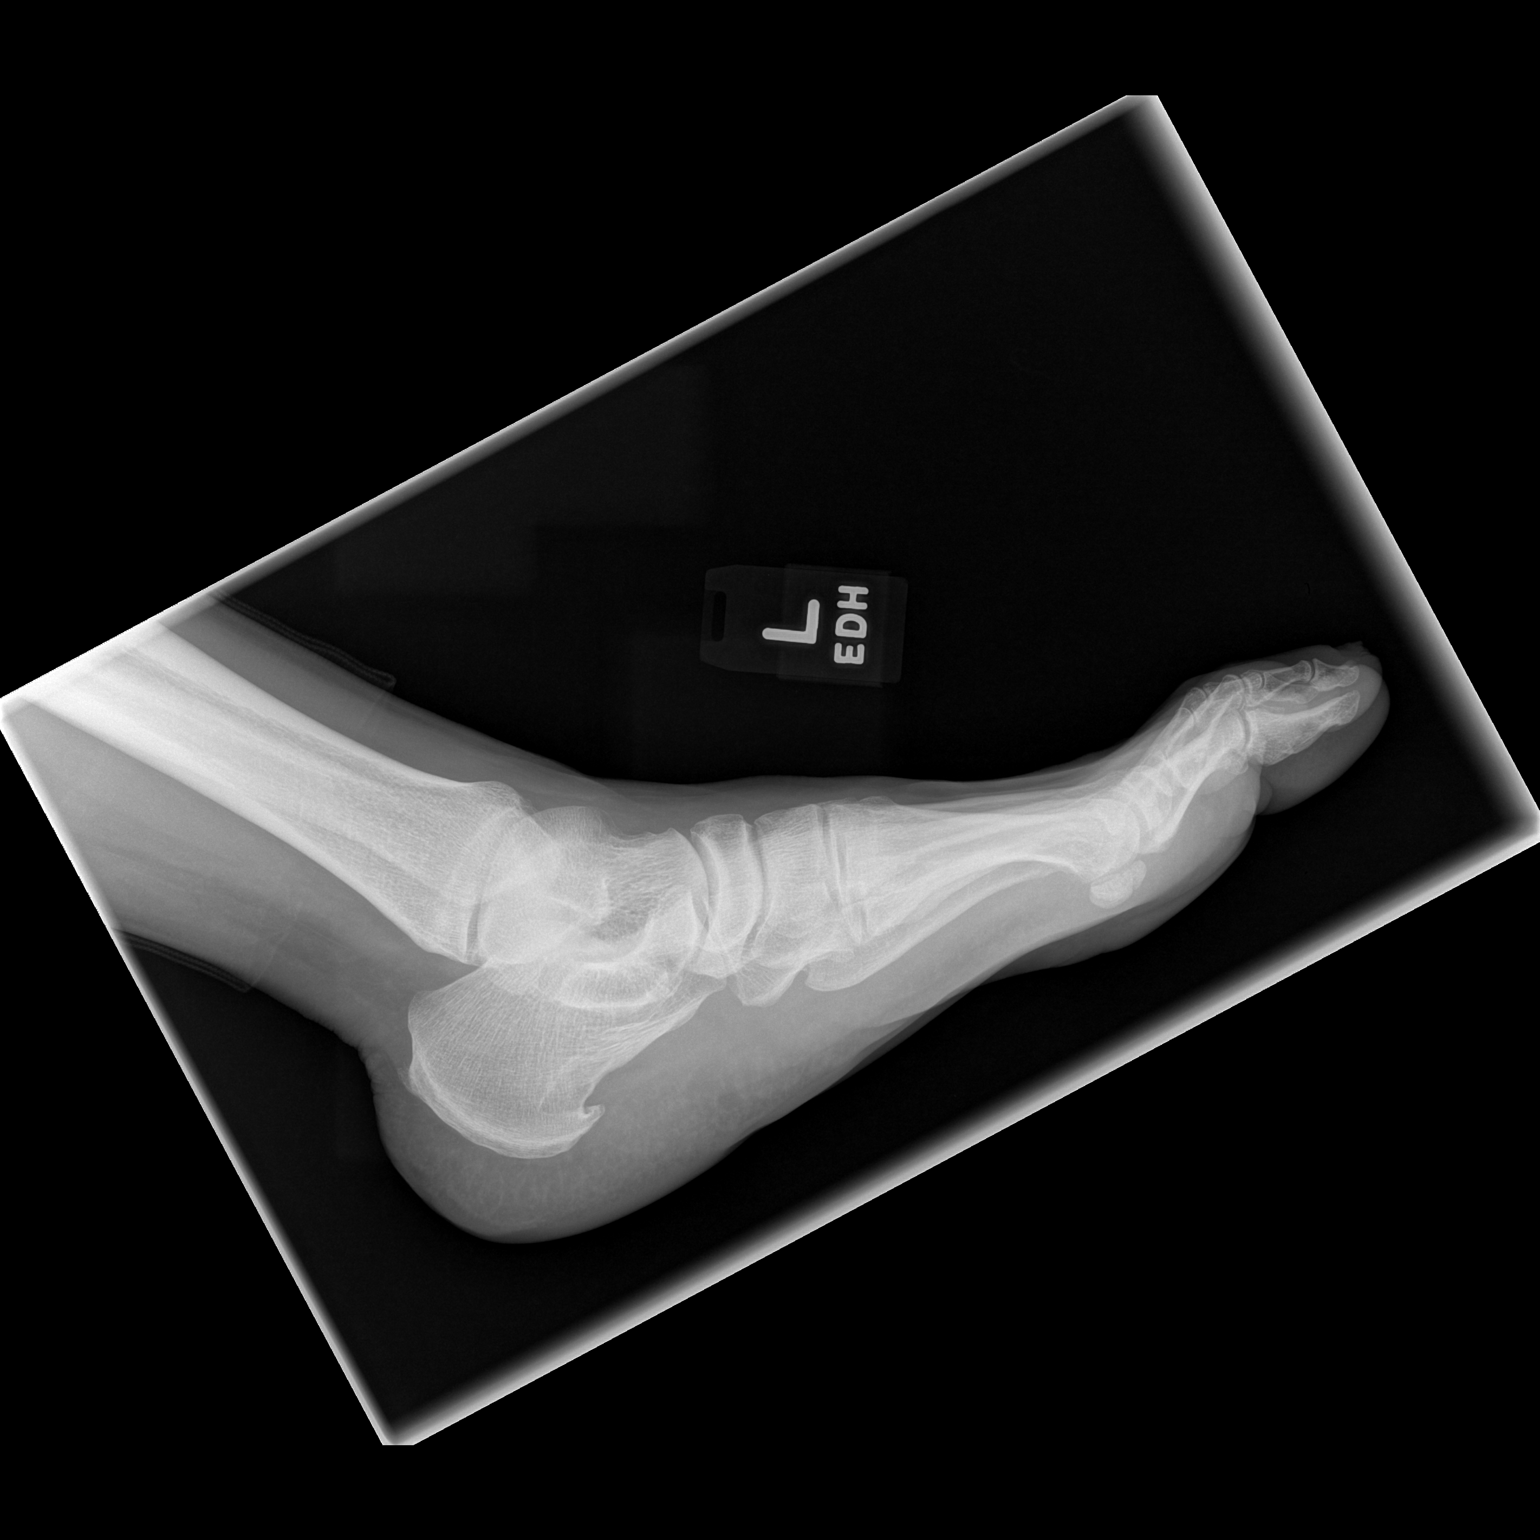

[2 of 2 positions shown; findings below may reference images not displayed]

FINDINGS: No evidence for acute fracture or subluxation. No soft tissue
swelling. Note is made of small plantar calcaneal spur.
IMPRESSION: No evidence for acute abnormality.

## 2022-06-04 DIAGNOSIS — Z125 Encounter for screening for malignant neoplasm of prostate: Secondary | ICD-10-CM | POA: Diagnosis not present

## 2022-06-04 DIAGNOSIS — Z23 Encounter for immunization: Secondary | ICD-10-CM | POA: Diagnosis not present

## 2022-06-04 DIAGNOSIS — M2142 Flat foot [pes planus] (acquired), left foot: Secondary | ICD-10-CM | POA: Diagnosis not present

## 2022-06-04 DIAGNOSIS — Z13220 Encounter for screening for lipoid disorders: Secondary | ICD-10-CM | POA: Diagnosis not present

## 2022-06-04 DIAGNOSIS — R499 Unspecified voice and resonance disorder: Secondary | ICD-10-CM | POA: Diagnosis not present

## 2022-06-04 DIAGNOSIS — L84 Corns and callosities: Secondary | ICD-10-CM | POA: Diagnosis not present

## 2022-06-04 DIAGNOSIS — Z0000 Encounter for general adult medical examination without abnormal findings: Secondary | ICD-10-CM | POA: Diagnosis not present

## 2022-06-04 DIAGNOSIS — M2141 Flat foot [pes planus] (acquired), right foot: Secondary | ICD-10-CM | POA: Diagnosis not present

## 2022-06-04 DIAGNOSIS — Z6836 Body mass index (BMI) 36.0-36.9, adult: Secondary | ICD-10-CM | POA: Diagnosis not present

## 2022-06-19 ENCOUNTER — Ambulatory Visit (INDEPENDENT_AMBULATORY_CARE_PROVIDER_SITE_OTHER): Payer: BC Managed Care – PPO | Admitting: Podiatry

## 2022-06-19 DIAGNOSIS — Q666 Other congenital valgus deformities of feet: Secondary | ICD-10-CM | POA: Diagnosis not present

## 2022-06-19 NOTE — Progress Notes (Signed)
  Subjective:  Patient ID: Jesus [name], male    DOB: 11-08-1970,  MRN: [id number]  Chief Complaint  Patient presents with   Callouses    52 y.o. male presents with the above complaint.  Patient presents with complaint left fifth metatarsal base porokeratotic lesion.  Patient states pain for touch is progressive gotten worse worse with ambulation worse with pressure has been try to keep it down.  He wanted to discuss options.  [facility] and [facility]otics which has not been helping.  He would like to discuss other management.   Review of Systems: Negative except as noted in the HPI. Denies N/V/F/Ch.  No past medical history on file.  Current Outpatient Medications:    predniSONE (DELTASONE) 20 MG tablet, Take 40 mg by mouth daily. (Patient not taking: Reported on 03/18/2020), Disp: , Rfl:   Social History   Tobacco Use  Smoking Status Not on file  Smokeless Tobacco Not on file    Allergies  Allergen Reactions   Influenza Virus Vaccine Other (See Comments)   Objective:  There were no vitals filed for this visit. There is no height or weight on file to calculate BMI. Constitutional Well developed. Well nourished.  Vascular Dorsalis pedis pulses palpable bilaterally. Posterior tibial pulses palpable bilaterally. Capillary refill normal to all digits.  No cyanosis or clubbing noted. Pedal hair growth normal.  Neurologic Normal speech. Oriented to person, place, and time. Epicritic sensation to light touch grossly present bilaterally.  Dermatologic Left fifth metatarsal base porokeratotic lesion noted pain on palpation.  Pes planovalgus foot structure noted calcaneovalgus to many toe signs unable to recreate the arch with dorsiflexion of the hallux unable to perform single and double heel raise.  Orthopedic: Normal joint ROM without pain or crepitus bilaterally. No visible deformities. No bony tenderness.   Radiographs: None Assessment:   1. Pes  planovalgus    Plan:  Patient was evaluated and treated and all questions answered.  Left fifth metatarsal base porokeratosis with underlying pes planovalgus -I discussed the options to offload the fifth metatarsal base.  At this time we will focus on orthotics management.  He agrees with the plan  -Pes planovalgus -I explained to patient the etiology of pes planovalgus and relationship with Planter fasciitis and various treatment options were discussed.  Given patient foot structure in the setting of Planter fasciitis I believe patient will benefit from custom-made orthotics to help control the hindfoot motion support the arch of the foot and take the stress away from plantar fascial.  Patient agrees with the plan like to proceed with orthotics -Patient was casted for orthotics   No follow-ups on file.

## 2022-07-20 DIAGNOSIS — M62461 Contracture of muscle, right lower leg: Secondary | ICD-10-CM | POA: Diagnosis not present

## 2022-07-20 DIAGNOSIS — M9901 Segmental and somatic dysfunction of cervical region: Secondary | ICD-10-CM | POA: Diagnosis not present

## 2022-07-20 DIAGNOSIS — M9905 Segmental and somatic dysfunction of pelvic region: Secondary | ICD-10-CM | POA: Diagnosis not present

## 2022-07-20 DIAGNOSIS — M9903 Segmental and somatic dysfunction of lumbar region: Secondary | ICD-10-CM | POA: Diagnosis not present

## 2022-07-27 DIAGNOSIS — M9901 Segmental and somatic dysfunction of cervical region: Secondary | ICD-10-CM | POA: Diagnosis not present

## 2022-07-27 DIAGNOSIS — M9903 Segmental and somatic dysfunction of lumbar region: Secondary | ICD-10-CM | POA: Diagnosis not present

## 2022-07-27 DIAGNOSIS — M62461 Contracture of muscle, right lower leg: Secondary | ICD-10-CM | POA: Diagnosis not present

## 2022-07-31 ENCOUNTER — Ambulatory Visit (INDEPENDENT_AMBULATORY_CARE_PROVIDER_SITE_OTHER): Payer: BC Managed Care – PPO | Admitting: Podiatry

## 2022-07-31 DIAGNOSIS — Q666 Other congenital valgus deformities of feet: Secondary | ICD-10-CM

## 2022-08-01 ENCOUNTER — Telehealth: Payer: Self-pay | Admitting: Podiatry

## 2022-08-01 NOTE — Telephone Encounter (Signed)
I called pt. Unable to leave a vm, due to the machine not being set up yet. Need to speak with him about his leave paperwork.

## 2022-08-06 DIAGNOSIS — M9901 Segmental and somatic dysfunction of cervical region: Secondary | ICD-10-CM | POA: Diagnosis not present

## 2022-08-06 DIAGNOSIS — M62461 Contracture of muscle, right lower leg: Secondary | ICD-10-CM | POA: Diagnosis not present

## 2022-08-06 DIAGNOSIS — M9903 Segmental and somatic dysfunction of lumbar region: Secondary | ICD-10-CM | POA: Diagnosis not present

## 2022-08-08 ENCOUNTER — Telehealth: Payer: Self-pay | Admitting: Podiatry

## 2022-08-08 NOTE — Telephone Encounter (Signed)
Jesus [name] would like intermittent leave, just in case his feet are hurting too bad to go to work. He would also like a statement saying that he is not allowed to work more than his regular 37.5 hours per week. He wants to refrain from working overtime due to his pain.

## 2022-08-17 DIAGNOSIS — M62461 Contracture of muscle, right lower leg: Secondary | ICD-10-CM | POA: Diagnosis not present

## 2022-08-17 DIAGNOSIS — M9903 Segmental and somatic dysfunction of lumbar region: Secondary | ICD-10-CM | POA: Diagnosis not present

## 2022-08-17 DIAGNOSIS — M9901 Segmental and somatic dysfunction of cervical region: Secondary | ICD-10-CM | POA: Diagnosis not present

## 2022-08-31 DIAGNOSIS — M9903 Segmental and somatic dysfunction of lumbar region: Secondary | ICD-10-CM | POA: Diagnosis not present

## 2022-08-31 DIAGNOSIS — M9901 Segmental and somatic dysfunction of cervical region: Secondary | ICD-10-CM | POA: Diagnosis not present

## 2022-08-31 DIAGNOSIS — M62461 Contracture of muscle, right lower leg: Secondary | ICD-10-CM | POA: Diagnosis not present

## 2022-09-03 DIAGNOSIS — K219 Gastro-esophageal reflux disease without esophagitis: Secondary | ICD-10-CM | POA: Diagnosis not present

## 2022-09-10 DIAGNOSIS — M79676 Pain in unspecified toe(s): Secondary | ICD-10-CM

## 2022-10-05 DIAGNOSIS — M62461 Contracture of muscle, right lower leg: Secondary | ICD-10-CM | POA: Diagnosis not present

## 2022-10-05 DIAGNOSIS — M9901 Segmental and somatic dysfunction of cervical region: Secondary | ICD-10-CM | POA: Diagnosis not present

## 2022-10-05 DIAGNOSIS — M9903 Segmental and somatic dysfunction of lumbar region: Secondary | ICD-10-CM | POA: Diagnosis not present

## 2022-10-19 DIAGNOSIS — M62461 Contracture of muscle, right lower leg: Secondary | ICD-10-CM | POA: Diagnosis not present

## 2022-10-19 DIAGNOSIS — M9901 Segmental and somatic dysfunction of cervical region: Secondary | ICD-10-CM | POA: Diagnosis not present

## 2022-10-19 DIAGNOSIS — M9903 Segmental and somatic dysfunction of lumbar region: Secondary | ICD-10-CM | POA: Diagnosis not present

## 2022-10-26 ENCOUNTER — Ambulatory Visit (INDEPENDENT_AMBULATORY_CARE_PROVIDER_SITE_OTHER): Payer: BC Managed Care – PPO | Admitting: Podiatry

## 2022-10-26 DIAGNOSIS — Q666 Other congenital valgus deformities of feet: Secondary | ICD-10-CM

## 2022-10-26 NOTE — Progress Notes (Unsigned)
  Subjective:  Patient ID: Jesus [name], male    DOB: 1970-06-09,  MRN: [id number]  Chief Complaint  Patient presents with   Foot Pain    52 y.o. male presents with the above complaint.  Patient presents with complaint left fifth metatarsal base porokeratotic lesion.  States he still has pain is being managed but still not able to do full-time work   Review of Systems: Negative except as noted in the HPI. Denies N/V/F/Ch.  No past medical history on file.  Current Outpatient Medications:    predniSONE (DELTASONE) 20 MG tablet, Take 40 mg by mouth daily. (Patient not taking: Reported on 03/18/2020), Disp: , Rfl:   Social History   Tobacco Use  Smoking Status Not on file  Smokeless Tobacco Not on file    Allergies  Allergen Reactions   Influenza Virus Vaccine Other (See Comments)   Objective:  There were no vitals filed for this visit. There is no height or weight on file to calculate BMI. Constitutional Well developed. Well nourished.  Vascular Dorsalis pedis pulses palpable bilaterally. Posterior tibial pulses palpable bilaterally. Capillary refill normal to all digits.  No cyanosis or clubbing noted. Pedal hair growth normal.  Neurologic Normal speech. Oriented to person, place, and time. Epicritic sensation to light touch grossly present bilaterally.  Dermatologic Left fifth metatarsal base porokeratotic lesion noted pain on palpation.  Pes planovalgus foot structure noted calcaneovalgus to many toe signs unable to recreate the arch with dorsiflexion of the hallux unable to perform single and double heel raise.  Orthopedic: Normal joint ROM without pain or crepitus bilaterally. No visible deformities. No bony tenderness.   Radiographs: None Assessment:   No diagnosis found.  Plan:  Patient was evaluated and treated and all questions answered.  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  At this time we will focus on orthotics management.  He agrees with the plan -We will extend the disability for another 4 months as he is not a diabetic and likely due to pain  -Pes planovalgus -I explained to patient the etiology of pes planovalgus and relationship with Planter fasciitis and various treatment options were discussed.  Given patient foot structure in the setting of Planter fasciitis I believe patient will benefit from custom-made orthotics to help control the hindfoot motion support the arch of the foot and take the stress away from plantar fascial.  Patient agrees with the plan like to proceed with orthotics -Orthotics are functioning    No follow-ups on file.

## 2022-10-31 ENCOUNTER — Ambulatory Visit: Payer: BC Managed Care – PPO | Admitting: Podiatry

## 2022-11-09 DIAGNOSIS — M9903 Segmental and somatic dysfunction of lumbar region: Secondary | ICD-10-CM | POA: Diagnosis not present

## 2022-11-09 DIAGNOSIS — M9901 Segmental and somatic dysfunction of cervical region: Secondary | ICD-10-CM | POA: Diagnosis not present

## 2022-11-09 DIAGNOSIS — M62461 Contracture of muscle, right lower leg: Secondary | ICD-10-CM | POA: Diagnosis not present

## 2023-02-25 NOTE — Progress Notes (Signed)
Seen by casting department

## 2023-03-26 ENCOUNTER — Telehealth: Payer: Self-pay | Admitting: Podiatry

## 2023-03-26 NOTE — Telephone Encounter (Signed)
Pt calling to check on ordering a 2nd pair of orthotics, last pair was in January 2024. I did give him the code to check to see if insurance would cover a second pair. He will let us know if he wants to proceed. He also stated he has a flex spending account and may put 300.00 down and we could set up payment plan for the rest if he decides he wants to proceed.

## 2023-04-17 ENCOUNTER — Ambulatory Visit: Payer: BC Managed Care – PPO

## 2023-04-17 NOTE — Progress Notes (Signed)
Orthotic eval   Patient was seen, measured / scanned for custom molded foot orthotics. UCB style orthotic with deep seating will be better suited for patient rather than traditional CFO, patient has pes planus with over-pronation Right greater with navicular drop hitting floor to help reduce pronation and help prevent further deformity deeper orthotic will help to increase medial arch support and decrease progression of deformation.  Patient will also benefit from CFO's as they will help provide total contact to MLA's helping to better distribute body weight across BIL feet greater reducing plantar pressure and pain and to also encourage FF and RF alignment.  Patient was scanned items to be ordered and fit when in  [employer], CFo, CFm

## 2023-05-08 ENCOUNTER — Telehealth: Payer: Self-pay

## 2023-05-08 NOTE — Telephone Encounter (Signed)
LEFT VM TO SCHEDULE ORTHOTIC PICK UP

## 2023-05-23 ENCOUNTER — Ambulatory Visit (INDEPENDENT_AMBULATORY_CARE_PROVIDER_SITE_OTHER): Payer: BC Managed Care – PPO

## 2023-05-23 DIAGNOSIS — Q666 Other congenital valgus deformities of feet: Secondary | ICD-10-CM | POA: Diagnosis not present

## 2023-05-23 DIAGNOSIS — M2141 Flat foot [pes planus] (acquired), right foot: Secondary | ICD-10-CM

## 2023-05-23 NOTE — Progress Notes (Signed)
Patient presents today to pick up custom molded foot orthotics, diagnosed with Pes Planus by Dr. Allena Katz.   Orthotics were dispensed and fit was satisfactory. Reviewed instructions for break-in and wear. Written instructions given to patient.  Patient will follow up as needed.   Addison Bailey Cped, CFo, CFm

## 2023-06-04 ENCOUNTER — Other Ambulatory Visit: Payer: BC Managed Care – PPO

## 2023-06-05 DIAGNOSIS — Z125 Encounter for screening for malignant neoplasm of prostate: Secondary | ICD-10-CM | POA: Diagnosis not present

## 2023-06-05 DIAGNOSIS — Z13220 Encounter for screening for lipoid disorders: Secondary | ICD-10-CM | POA: Diagnosis not present

## 2023-06-05 DIAGNOSIS — Z0000 Encounter for general adult medical examination without abnormal findings: Secondary | ICD-10-CM | POA: Diagnosis not present

## 2023-09-18 ENCOUNTER — Ambulatory Visit

## 2023-09-20 NOTE — Progress Notes (Signed)
 Remade orthotics fit today patient states they feel much better will try and will call if any problems arise

## 2024-04-13 DIAGNOSIS — B349 Viral infection, unspecified: Secondary | ICD-10-CM | POA: Diagnosis not present

## 2024-04-13 DIAGNOSIS — R051 Acute cough: Secondary | ICD-10-CM | POA: Diagnosis not present

## 2024-04-29 DIAGNOSIS — J40 Bronchitis, not specified as acute or chronic: Secondary | ICD-10-CM | POA: Diagnosis not present

## 2024-05-04 DIAGNOSIS — J40 Bronchitis, not specified as acute or chronic: Secondary | ICD-10-CM | POA: Diagnosis not present

## 2024-05-19 DIAGNOSIS — R053 Chronic cough: Secondary | ICD-10-CM | POA: Diagnosis not present

## 2024-05-19 DIAGNOSIS — Z23 Encounter for immunization: Secondary | ICD-10-CM | POA: Diagnosis not present

## 2024-05-24 ENCOUNTER — Encounter: Payer: Self-pay | Admitting: Emergency Medicine

## 2024-05-24 ENCOUNTER — Ambulatory Visit: Admission: EM | Admit: 2024-05-24 | Discharge: 2024-05-24 | Disposition: A | Discharge status: Home / Self Care

## 2024-05-24 DIAGNOSIS — J101 Influenza due to other identified influenza virus with other respiratory manifestations: Secondary | ICD-10-CM | POA: Diagnosis not present

## 2024-05-24 LAB — POCT INFLUENZA A/B
Influenza A, POC: POSITIVE — AB
Influenza B, POC: NEGATIVE

## 2024-05-24 LAB — POC SOFIA SARS ANTIGEN FIA: SARS Coronavirus 2 Ag: NEGATIVE

## 2024-05-24 MED ORDER — BENZONATATE 100 MG PO CAPS: 100.0000 mg | ORAL_CAPSULE | Freq: Three times a day (TID) | ORAL | 0 refills | Status: AC

## 2024-05-24 MED ORDER — OSELTAMIVIR PHOSPHATE 75 MG PO CAPS: 75.0000 mg | ORAL_CAPSULE | Freq: Two times a day (BID) | ORAL | 0 refills | Status: AC

## 2024-05-24 MED ORDER — ACETAMINOPHEN 325 MG PO TABS
650.0000 mg | ORAL_TABLET | Freq: Once | ORAL | Status: AC
  Administered 2024-05-24: 650 mg via ORAL

## 2024-05-24 NOTE — Discharge Instructions (Addendum)

## 2024-05-24 NOTE — ED Provider Notes (Signed)
 " [facility]    CSN: [id number] Arrival date & time: 05/24/24  0920      History   Chief Complaint Chief Complaint  Patient presents with   Flu type symptoms    HPI Jesus [name] is a 53 y.o. male.   Pt presents today due to body aches, fatigue, and dry cough that started yesterday. Pt was found to have a fever during triage. Pt states that he has been using OTC cold meds with little relief. Pt states that he has an appetite and is still eating and drinking.   The history is provided by the patient.    History reviewed. No pertinent past medical history.  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 No pertinent surgical history.     Home Medications    Prior to Admission medications  Medication Sig Start Date End Date Taking? Authorizing Provider  benzonatate  (TESSALON ) 100 MG capsule Take 1 capsule (100 mg total) by mouth every 8 (eight) hours. 05/24/24  Yes Andra Krabbe C, PA-C  oseltamivir  (TAMIFLU ) 75 MG capsule Take 1 capsule (75 mg total) by mouth every 12 (twelve) hours. 05/24/24  Yes Andra Krabbe BROCKS, PA-C  predniSONE (DELTASONE) 20 MG tablet Take 40 mg by mouth daily. Patient not taking: Reported on 03/18/2020 11/19/19   [provider]    Family History No family history on file.  Social History Social History[1]   Allergies   Influenza virus vaccine   Review of Systems Review of Systems   Physical Exam Triage Vital Signs ED Triage Vitals  Encounter Vitals Group     BP 05/24/24 1123 108/69     Girls Systolic BP Percentile --      Girls Diastolic BP Percentile --      Boys Systolic BP Percentile --      Boys Diastolic BP Percentile --      Pulse Rate 05/24/24 1123 (!) 124     Resp 05/24/24 1123 18     Temp 05/24/24 1123 (!) 102.3 F (39.1 C)     Temp Source 05/24/24 1123 Oral     SpO2 05/24/24 1123 95 %     Weight 05/24/24 1127 238 lb  (108 kg)     Height 05/24/24 1127 5' 7 (1.702 m)     Head Circumference --      Peak Flow --      Pain Score 05/24/24 1126 7     Pain Loc --      Pain Education --      Exclude from Growth Chart --    No data found.  Updated Vital Signs BP 108/69 (BP Location: Left Arm)   Pulse (!) 124   Temp (!) 102.3 F (39.1 C) (Oral)   Resp 18   Ht 5' 7 (1.702 m)   Wt 238 lb (108 kg)   SpO2 95%   BMI 37.28 kg/m   Visual Acuity Right Eye Distance:   Left Eye Distance:   Bilateral Distance:    Right Eye Near:   Left Eye Near:    Bilateral Near:     Physical Exam Vitals and nursing note reviewed.  Constitutional:      General: He is not in acute distress.    Appearance: Normal appearance. He is not ill-appearing, toxic-appearing or diaphoretic.  HENT:     Nose: Congestion (moderately enlarged turbinates) present. No rhinorrhea.     Mouth/Throat:  Mouth: Mucous membranes are moist.     Pharynx: Oropharynx is clear. No oropharyngeal exudate or posterior oropharyngeal erythema.  Eyes:     General: No scleral icterus. Cardiovascular:     Rate and Rhythm: Normal rate and regular rhythm.     Heart sounds: Normal heart sounds.  Pulmonary:     Effort: Pulmonary effort is normal. No respiratory distress.     Breath sounds: Normal breath sounds. No wheezing or rhonchi.  Skin:    General: Skin is warm.  Neurological:     Mental Status: He is alert and oriented to person, place, and time.  Psychiatric:        Mood and Affect: Mood normal.        Behavior: Behavior normal.      UC Treatments / Results  Labs (all labs ordered are listed, but only abnormal results are displayed) Labs Reviewed  POCT INFLUENZA A/B - Abnormal; Notable for the following components:      Result Value   Influenza A, POC Positive (*)    All other components within normal limits  POC SOFIA SARS ANTIGEN FIA    EKG   Radiology No results found.  Procedures Procedures (including critical  care time)  Medications Ordered in UC Medications  acetaminophen  (TYLENOL ) tablet 650 mg (650 mg Oral Given 05/24/24 1133)    Initial Impression / Assessment and Plan / UC Course  I have reviewed the triage vital signs and the nursing notes.  Pertinent labs & imaging results that were available during my care of the patient were reviewed by me and considered in my medical decision making (see chart for details).    Final Clinical Impressions(s) / UC Diagnoses   Final diagnoses:  Influenza A     Discharge Instructions      You been diagnosed with a viral illness today. -Viruses have to run their course and medicines that are prescribed are meant to help with symptoms. - With viruses usually feel poorly from 3 to 7 days with cough being the last symptoms to resolve.  -Cough can linger from days to weeks.  Antibiotics are not effective for viruses. -If your cough lasts more than 2 weeks and you are coughing so hard that you are vomiting or feel like you could pass out we need to follow-up with PCP for further testing and evaluation. -Rest, increase water intake, may use pseudoephedrine for nasal congestion, Delsym (dextromethorphan) or honey as needed for cough, and ibuprofen and/or Tylenol  as directed on packaging for pain and fever. -If you have hypertension you should take Coricidin or other OTC meds approved for people with high blood pressure. -You may use a spoonful of honey every 4-6 hours as needed for throat pain and cough. -Warm tea with honey and lemon are helpful for soothe throat as well.  Chloraseptic and Cepacol make a throat lozenge with numbing medication, can be purchased over-the-counter. -May also use Flonase or sinus rinse for sinus pressure or nasal congestion.  Be sure to use distilled bottled water for sinus rinses. -May use coolmist humidifier to open up nasal passages -May elevate head to assist with postnasal drainage. -If you feel poorly (fever, fatigue,  shortness of breath, nausea, etc.) for more than 10 days to be sure to follow-up with PCP or in clinic for further evaluation and additional treatments. If you experience chest pain with shortness of breath or pulse oxygen less than 95% you should report to the ER.    ED Prescriptions  Medication Sig Dispense Auth. Provider   oseltamivir  (TAMIFLU ) 75 MG capsule Take 1 capsule (75 mg total) by mouth every 12 (twelve) hours. 10 capsule Andra Krabbe C, PA-C   benzonatate  (TESSALON ) 100 MG capsule Take 1 capsule (100 mg total) by mouth every 8 (eight) hours. 30 capsule Andra Krabbe BROCKS, PA-C      PDMP not reviewed this encounter.    [1]  Social History Tobacco Use   Smoking status: Never    Passive exposure: Never   Smokeless tobacco: Never  Vaping Use   Vaping status: Never Used  Substance Use Topics   Alcohol use: Never   Drug use: Never     Andra Krabbe BROCKS, PA-C 05/24/24 1154  "

## 2024-05-24 NOTE — ED Triage Notes (Signed)
 Pt c/o nonproductive cough, generalized body aches, and fatigue  Onset yesterday
